# Patient Record
Sex: Male | Born: 1960
Health system: Southern US, Community
[De-identification: ages and names within clinical notes are randomized; demographics above are authoritative.]

## PROBLEM LIST (undated history)

## (undated) DIAGNOSIS — S060X9A Concussion with loss of consciousness of unspecified duration, initial encounter: Secondary | ICD-10-CM

## (undated) DIAGNOSIS — E785 Hyperlipidemia, unspecified: Secondary | ICD-10-CM

## (undated) DIAGNOSIS — S060XAA Concussion with loss of consciousness status unknown, initial encounter: Secondary | ICD-10-CM

## (undated) HISTORY — PX: EYE SURGERY: SHX253

## (undated) HISTORY — PX: NO PAST SURGERIES: SHX2092

## (undated) HISTORY — DX: Hyperlipidemia, unspecified: E78.5

---

## 2006-06-26 ENCOUNTER — Emergency Department (HOSPITAL_COMMUNITY): Admission: EM | Admit: 2006-06-26 | Discharge: 2006-06-26 | Payer: Self-pay | Admitting: *Deleted

## 2013-10-24 ENCOUNTER — Ambulatory Visit: Payer: Self-pay | Admitting: Family Medicine

## 2013-10-26 ENCOUNTER — Ambulatory Visit: Payer: Self-pay | Admitting: Family Medicine

## 2013-11-04 ENCOUNTER — Encounter: Payer: Self-pay | Admitting: Family Medicine

## 2013-11-04 ENCOUNTER — Ambulatory Visit (INDEPENDENT_AMBULATORY_CARE_PROVIDER_SITE_OTHER): Payer: BC Managed Care – PPO | Admitting: Family Medicine

## 2013-11-04 VITALS — BP 128/76 | HR 76 | Temp 97.2°F | Resp 18 | Ht 67.0 in | Wt 233.8 lb

## 2013-11-04 DIAGNOSIS — Z0000 Encounter for general adult medical examination without abnormal findings: Secondary | ICD-10-CM

## 2013-11-04 LAB — GLUCOSE, POCT (MANUAL RESULT ENTRY): POC GLUCOSE: 89 mg/dL (ref 70–99)

## 2013-11-04 NOTE — Progress Notes (Signed)
   Subjective:    Patient ID: Christopher Lin, male    DOB: 12/21/1960, 53 y.o.   MRN: [id number]  HPI Pt here for cpe. Is healthy in general and as no concerns. Sees PA at work and had bloodwork done a few months gaop at work. Says only thing that was concerning was cholesterol and he has been working on lifestyle mods since then.   # Health maint - mammo/pert FH - adopted, knows no FH - PSA and DRE- discussed psa, pros and cons including that USPTF advises against. Pt would like psa screening. - c-scope/pert FH - DUE - dexa - no h/o pathological fractures - BP screen - wnl - cholesterol screen - screened at work in the fall, says total was >200, would like to see where it is today - fasting glucose screen -  Checked at work, says thinks was fine. Has not eaten today.  - chlamydia screen (if male under age 51) - divorced, not currently sexually active. Declines std testing - tobacco - nonsmoker - alcohol - nondrinker - drugs - none - Hep C (born [id number]) - has not had checked - tdap (once over age 53 in place of DT) - thinks he has had this, will check records - DT (every 10 years until age 53, once age 73+) - " - flu - declines - pneumovax - not indicated  - zostavax (if over 3460) - not indicated - vit D - was told last year that vit D was low, took high dose and now is on 5K units daily. Did not have f/u testing.   Review of Systems A 12 point review of systems is negative except as per hpi.       Objective:   Physical Exam Nursing note and vitals reviewed. Constitutional: He is oriented to person, place, and time. He  appears well-developed and well-nourished.  HENT:  Right Ear: External ear normal.  Left Ear: External ear normal.  Nose: Nose normal.  Mouth/Throat: Oropharynx is clear and moist. No oropharyngeal exudate.  Eyes: Conjunctivae are normal. Pupils are equal, round, and reactive to light.  Neck: Normal range of motion. Neck supple. No thyromegaly present.    Cardiovascular: Normal rate, regular rhythm and normal heart sounds.   Pulmonary/Chest: Effort normal and breath sounds normal.  Abdominal: Soft. Bowel sounds are normal.  no distension. There is no tenderness. There is no rebound.  Lymphadenopathy:    He has no cervical adenopathy.  Neurological: He is alert and oriented to person, place, and time. He has normal reflexes.  Skin: Skin is warm and dry.He has no concerning moles or skin lesions. He does have a 1cm firm area on right abdomen, says has been there for many years. Seems mobile with skin. Consist with cyst.  Psychiatric: He has a normal mood and affect. His behavior is normal.        Assessment & Plan:  Christopher Lin was seen today for annual exam.  Diagnoses and associated orders for this visit:  Health maintenance examination - PSA - Ambulatory referral to Gastroenterology - screening colonoscopy - Lipid Panel - POCT glucose (manual entry) - Hepatic function panel - Hepatitis C antibody - Vit D  25 hydroxy (rtn osteoporosis monitoring)   F/u 1 year cpe, earlier if needed

## 2013-11-04 NOTE — Patient Instructions (Signed)

## 2013-11-05 LAB — LIPID PANEL
CHOL/HDL RATIO: 4.2 ratio
CHOLESTEROL: 179 mg/dL (ref 0–200)
HDL: 43 mg/dL (ref >39)
LDL Cholesterol: 119 mg/dL — ABNORMAL HIGH (ref 0–99)
Triglycerides: 86 mg/dL (ref <150)
VLDL: 17 mg/dL (ref 0–40)

## 2013-11-05 LAB — HEPATIC FUNCTION PANEL
ALBUMIN: 4.5 g/dL (ref 3.5–5.2)
ALT: 20 U/L (ref 0–53)
AST: 20 U/L (ref 0–37)
Alkaline Phosphatase: 64 U/L (ref 39–117)
BILIRUBIN TOTAL: 0.9 mg/dL (ref 0.2–1.2)
Bilirubin, Direct: 0.2 mg/dL (ref 0.0–0.3)
Indirect Bilirubin: 0.7 mg/dL (ref 0.2–1.2)
TOTAL PROTEIN: 6.4 g/dL (ref 6.0–8.3)

## 2013-11-05 LAB — VITAMIN D 25 HYDROXY (VIT D DEFICIENCY, FRACTURES): VIT D 25 HYDROXY: 69 ng/mL (ref 30–89)

## 2013-11-22 ENCOUNTER — Telehealth: Payer: Self-pay | Admitting: *Deleted

## 2013-11-22 NOTE — Telephone Encounter (Signed)
Pt called and left VM stating that he had labs drawn on office visit in February and has not received results. Pt requests lab results. Will route to MD

## 2013-11-25 ENCOUNTER — Encounter (INDEPENDENT_AMBULATORY_CARE_PROVIDER_SITE_OTHER): Payer: Self-pay | Admitting: *Deleted

## 2013-11-25 NOTE — Telephone Encounter (Signed)
Labs fine - thanks AW

## 2013-11-25 NOTE — Telephone Encounter (Signed)
Pt notified ans appreciative.

## 2013-12-15 ENCOUNTER — Encounter (INDEPENDENT_AMBULATORY_CARE_PROVIDER_SITE_OTHER): Payer: Self-pay | Admitting: *Deleted

## 2013-12-15 ENCOUNTER — Telehealth: Payer: Self-pay

## 2013-12-15 ENCOUNTER — Other Ambulatory Visit (INDEPENDENT_AMBULATORY_CARE_PROVIDER_SITE_OTHER): Payer: Self-pay | Admitting: *Deleted

## 2013-12-15 DIAGNOSIS — Z1211 Encounter for screening for malignant neoplasm of colon: Secondary | ICD-10-CM

## 2013-12-15 NOTE — Telephone Encounter (Signed)
Per Ginger, she called and pt is supposed to let us know if he is going to go with Dr. Karilyn Cotaehman or here for his colonoscopy.  He was referred to both.

## 2013-12-28 NOTE — Telephone Encounter (Signed)
Pt is scheduled with Dr. Karilyn Cotaehman for a colonoscopy on 04/27/2014.

## 2014-02-20 ENCOUNTER — Telehealth (INDEPENDENT_AMBULATORY_CARE_PROVIDER_SITE_OTHER): Payer: Self-pay | Admitting: *Deleted

## 2014-02-20 DIAGNOSIS — Z1211 Encounter for screening for malignant neoplasm of colon: Secondary | ICD-10-CM

## 2014-02-20 NOTE — Telephone Encounter (Signed)
Patient needs movi prep 

## 2014-03-02 MED ORDER — PEG-KCL-NACL-NASULF-NA ASC-C 100 G PO SOLR: 1.0000 | Freq: Once | ORAL | Status: DC

## 2014-04-04 ENCOUNTER — Telehealth (INDEPENDENT_AMBULATORY_CARE_PROVIDER_SITE_OTHER): Payer: Self-pay | Admitting: *Deleted

## 2014-04-04 NOTE — Telephone Encounter (Signed)
  Procedure: tcs  Reason/Indication:  screening  Has patient had this procedure before?  no  If so, when, by whom and where?    Is there a family history of colon cancer?  ?  Who?  What age when diagnosed?    Is patient diabetic?   no      Does patient have prosthetic heart valve?  no  Do you have a pacemaker?  no  Has patient ever had endocarditis? no  Has patient had joint replacement within last 12 months?  no  Does patient tend to be constipated or take laxatives? no  Is patient on Coumadin, Plavix and/or Aspirin? no  Medications: none  Allergies: pcn  Medication Adjustment:   Procedure date & time: 05/04/14 at 730

## 2014-04-05 NOTE — Telephone Encounter (Signed)
agree

## 2014-04-11 ENCOUNTER — Encounter (HOSPITAL_COMMUNITY): Payer: Self-pay | Admitting: Pharmacy Technician

## 2014-05-03 ENCOUNTER — Other Ambulatory Visit (INDEPENDENT_AMBULATORY_CARE_PROVIDER_SITE_OTHER): Payer: Self-pay | Admitting: *Deleted

## 2014-05-03 DIAGNOSIS — Z1211 Encounter for screening for malignant neoplasm of colon: Secondary | ICD-10-CM

## 2014-05-04 ENCOUNTER — Ambulatory Visit (HOSPITAL_COMMUNITY)
Admission: RE | Admit: 2014-05-04 | Discharge: 2014-05-04 | Disposition: A | Discharge status: Home / Self Care | Payer: BC Managed Care – PPO | Source: Ambulatory Visit | Attending: Internal Medicine | Admitting: Internal Medicine

## 2014-05-04 ENCOUNTER — Encounter (HOSPITAL_COMMUNITY)
Admission: RE | Disposition: A | Discharge status: Home / Self Care | Payer: Self-pay | Source: Ambulatory Visit | Attending: Internal Medicine

## 2014-05-04 ENCOUNTER — Encounter (HOSPITAL_COMMUNITY): Payer: Self-pay | Admitting: *Deleted

## 2014-05-04 DIAGNOSIS — K644 Residual hemorrhoidal skin tags: Secondary | ICD-10-CM

## 2014-05-04 DIAGNOSIS — E785 Hyperlipidemia, unspecified: Secondary | ICD-10-CM | POA: Insufficient documentation

## 2014-05-04 DIAGNOSIS — Z1211 Encounter for screening for malignant neoplasm of colon: Secondary | ICD-10-CM | POA: Insufficient documentation

## 2014-05-04 DIAGNOSIS — K5730 Diverticulosis of large intestine without perforation or abscess without bleeding: Secondary | ICD-10-CM | POA: Insufficient documentation

## 2014-05-04 HISTORY — PX: COLONOSCOPY: SHX5424

## 2014-05-04 SURGERY — COLONOSCOPY
Anesthesia: Moderate Sedation

## 2014-05-04 MED ORDER — MEPERIDINE HCL 50 MG/ML IJ SOLN
INTRAMUSCULAR | Status: AC
  Filled 2014-05-04: qty 1

## 2014-05-04 MED ORDER — MEPERIDINE HCL 50 MG/ML IJ SOLN
INTRAMUSCULAR | Status: DC | PRN
Start: 2014-05-04 — End: 2014-05-04
  Administered 2014-05-04 (×2): 25 mg via INTRAVENOUS

## 2014-05-04 MED ORDER — MIDAZOLAM HCL 5 MG/5ML IJ SOLN
INTRAMUSCULAR | Status: DC | PRN
  Administered 2014-05-04 (×4): 2 mg via INTRAVENOUS

## 2014-05-04 MED ORDER — SODIUM CHLORIDE 0.9 % IV SOLN
INTRAVENOUS | Status: DC
Start: 2014-05-04 — End: 2014-05-04
  Administered 2014-05-04: 07:00:00 via INTRAVENOUS

## 2014-05-04 MED ORDER — STERILE WATER FOR IRRIGATION IR SOLN
Status: DC | PRN
  Administered 2014-05-04: 08:00:00

## 2014-05-04 MED ORDER — MIDAZOLAM HCL 5 MG/5ML IJ SOLN
INTRAMUSCULAR | Status: AC
  Filled 2014-05-04: qty 10

## 2014-05-04 NOTE — Discharge Instructions (Signed)
Resume usual medications and high fiber diet. No driving for 24 hours. Next screening exam in 10 years. Colonoscopy, Care After Refer to this sheet in the next few weeks. These instructions provide you with information on caring for yourself after your procedure. Your health care provider may also give you more specific instructions. Your treatment has been planned according to current medical practices, but problems sometimes occur. Call your health care provider if you have any problems or questions after your procedure. WHAT TO EXPECT AFTER THE PROCEDURE  After your procedure, it is typical to have the following:  A small amount of blood in your stool.  Moderate amounts of gas and mild abdominal cramping or bloating. HOME CARE INSTRUCTIONS  Do not drive, operate machinery, or sign important documents for 24 hours.  You may shower and resume your regular physical activities, but move at a slower pace for the first 24 hours.  Take frequent rest periods for the first 24 hours.  Walk around or put a warm pack on your abdomen to help reduce abdominal cramping and bloating.  Drink enough fluids to keep your urine clear or pale yellow.  You may resume your normal diet as instructed by your health care provider. Avoid heavy or fried foods that are hard to digest.  Avoid drinking alcohol for 24 hours or as instructed by your health care provider.  Only take over-the-counter or prescription medicines as directed by your health care provider.  If a tissue sample (biopsy) was taken during your procedure:  Do not take aspirin or blood thinners for 7 days, or as instructed by your health care provider.  Do not drink alcohol for 7 days, or as instructed by your health care provider.  Eat soft foods for the first 24 hours. SEEK MEDICAL CARE IF: You have persistent spotting of blood in your stool 2-3 days after the procedure. SEEK IMMEDIATE MEDICAL CARE IF:  You have more than a small  spotting of blood in your stool.  You pass large blood clots in your stool.  Your abdomen is swollen (distended).  You have nausea or vomiting.  You have a fever.  You have increasing abdominal pain that is not relieved with medicine.  Diverticulosis Diverticulosis is the condition that develops when small pouches (diverticula) form in the wall of your colon. Your colon, or large intestine, is where water is absorbed and stool is formed. The pouches form when the inside layer of your colon pushes through weak spots in the outer layers of your colon. CAUSES  No one knows exactly what causes diverticulosis. RISK FACTORS  Being older than 50. Your risk for this condition increases with age. Diverticulosis is rare in people younger than 40 years. By age 63, almost everyone has it.  Eating a low-fiber diet.  Being frequently constipated.  Being overweight.  Not getting enough exercise.  Smoking.  Taking over-the-counter pain medicines, like aspirin and ibuprofen. SYMPTOMS  Most people with diverticulosis do not have symptoms. DIAGNOSIS  Because diverticulosis often has no symptoms, health care providers often discover the condition during an exam for other colon problems. In many cases, a health care provider will diagnose diverticulosis while using a flexible scope to examine the colon (colonoscopy). TREATMENT  If you have never developed an infection related to diverticulosis, you may not need treatment. If you have had an infection before, treatment may include:  Eating more fruits, vegetables, and grains.  Taking a fiber supplement.  Taking a live bacteria supplement (probiotic).  Taking medicine to relax your colon. HOME CARE INSTRUCTIONS   Drink at least 6-8 glasses of water each day to prevent constipation.  Try not to strain when you have a bowel movement.  Keep all follow-up appointments. If you have had an infection before:  Increase the fiber in your diet  as directed by your health care provider or dietitian.  Take a dietary fiber supplement if your health care provider approves.  Only take medicines as directed by your health care provider. SEEK MEDICAL CARE IF:   You have abdominal pain.  You have bloating.  You have cramps.  You have not gone to the bathroom in 3 days. SEEK IMMEDIATE MEDICAL CARE IF:   Your pain gets worse.  Yourbloating becomes very bad.  You have a fever or chills, and your symptoms suddenly get worse.  You begin vomiting.  You have bowel movements that are bloody or black. MAKE SURE YOU:  Understand these instructions.  Will watch your condition.  Will get help right away if you are not doing well or get worse.

## 2014-05-04 NOTE — Op Note (Signed)
COLONOSCOPY PROCEDURE REPORT  PATIENT:  Christopher Lin  MR#:  [id number] Birthdate:  [birth year], 53 y.o., male Endoscopist:  Dr. Malissa Hippo, MD Referred By:  Dr. Marchelle Folks. Lucretia Roers, MD  Procedure Date: 05/04/2014  Procedure:   Colonoscopy  Indications:  Patient is 52 year old Caucasian male who is here for screening colonoscopy. Risk status is unknown S. family history is unavailable.  Informed Consent:  The procedure and risks were reviewed with the patient and informed consent was obtained.  Medications:  Demerol 50 mg IV Versed 8 mg IV  Description of procedure:  After a digital rectal exam was performed, that colonoscope was advanced from the anus through the rectum and colon to the area of the cecum, ileocecal valve and appendiceal orifice. The cecum was deeply intubated. These structures were well-seen and photographed for the record. From the level of the cecum and ileocecal valve, the scope was slowly and cautiously withdrawn. The mucosal surfaces were carefully surveyed utilizing scope tip to flexion to facilitate fold flattening as needed. The scope was pulled down into the rectum where a thorough exam including retroflexion was performed.  Findings:   Prep excellent. Single diverticulum at hepatic flexure with a few more small ones at sigmoid colon. Redundant sigmoid colon. No evidence of colonic polyps. Normal rectal mucosa. Small hemorrhoids below the dentate line.    Therapeutic/Diagnostic Maneuvers Performed:  None  Complications:  None  Cecal Withdrawal Time:  7 minutes  Impression:  Examination performed to cecum. Single diverticulum at hepatic flexure and mild sigmoid colon diverticulosis. External hemorrhoids.  Recommendations:  Standard instructions given. Next screening exam in 10 years.  REHMAN,NAJEEB U  05/04/2014 8:16 AM  CC: Dr. Lucretia Roers, Kelle Darting, MD & Dr. Bonnetta Barry ref. provider found

## 2014-05-04 NOTE — H&P (Signed)
Christopher Lin is an 53 y.o. male.   Chief Complaint: Patient is here for colonoscopy. HPI: Patient is a 53 year old Caucasian male who is here for screening colonoscopy. He denies abdominal pain change in his bowel habits or rectal bleeding. Family history is that he was adopted.  Past Medical History  Diagnosis Date  . Hyperlipidemia     Past Surgical History  Procedure Laterality Date  . No past surgeries      Family History  Problem Relation Age of Onset  . Adopted: Yes   Social History:  reports that he has never smoked. He has never used smokeless tobacco. He reports that he does not drink alcohol or use illicit drugs.  Allergies: No Known Allergies  Medications Prior to Admission  Medication Sig Dispense Refill  . Cholecalciferol (VITAMIN D3) 5000 UNITS TABS Take 5,000 Units by mouth daily.      . Flaxseed, Linseed, (FLAX SEEDS PO) Take 1 tablet by mouth daily.      . peg 3350 powder (MOVIPREP) 100 G SOLR Take 1 kit (200 g total) by mouth once.  1 kit  0    No results found for this or any previous visit (from the past 48 hour(s)). No results found.  ROS  Blood pressure 129/78, pulse 61, temperature 98 F (36.7 C), temperature source Oral, resp. rate 18, height _0  (1.727 m), weight 223 lb (101.152 kg), SpO2 99.00%. Physical Exam  Constitutional: He appears well-developed and well-nourished.  HENT:  Mouth/Throat: Oropharynx is clear and moist.  Eyes: Conjunctivae are normal. No scleral icterus.  Neck: No thyromegaly present.  Cardiovascular: Normal rate, regular rhythm and normal heart sounds.   No murmur heard. Respiratory: Effort normal and breath sounds normal.  GI: Soft. He exhibits no mass. There is no tenderness. There is no rebound.  Musculoskeletal: He exhibits no edema.  Lymphadenopathy:    He has no cervical adenopathy.  Neurological: He is alert.  Skin: Skin is warm and dry.     Assessment/Plan Screening colonoscopy. Family history is not  available.  Christopher Lin U 05/04/2014, 7:31 AM

## 2014-05-05 ENCOUNTER — Encounter (HOSPITAL_COMMUNITY): Payer: Self-pay | Admitting: Internal Medicine

## 2016-01-24 DIAGNOSIS — E785 Hyperlipidemia, unspecified: Secondary | ICD-10-CM | POA: Insufficient documentation

## 2016-01-24 DIAGNOSIS — E669 Obesity, unspecified: Secondary | ICD-10-CM | POA: Insufficient documentation

## 2017-11-07 ENCOUNTER — Emergency Department (HOSPITAL_COMMUNITY): Payer: BLUE CROSS/BLUE SHIELD

## 2017-11-07 ENCOUNTER — Other Ambulatory Visit: Payer: Self-pay

## 2017-11-07 ENCOUNTER — Emergency Department (HOSPITAL_COMMUNITY)
Admission: EM | Admit: 2017-11-07 | Discharge: 2017-11-07 | Disposition: A | Discharge status: Home / Self Care | Payer: BLUE CROSS/BLUE SHIELD | Attending: Emergency Medicine | Admitting: Emergency Medicine

## 2017-11-07 ENCOUNTER — Encounter (HOSPITAL_COMMUNITY): Payer: Self-pay

## 2017-11-07 DIAGNOSIS — Y929 Unspecified place or not applicable: Secondary | ICD-10-CM | POA: Diagnosis not present

## 2017-11-07 DIAGNOSIS — R413 Other amnesia: Secondary | ICD-10-CM | POA: Diagnosis not present

## 2017-11-07 DIAGNOSIS — S4981XA Other specified injuries of right shoulder and upper arm, initial encounter: Secondary | ICD-10-CM | POA: Diagnosis present

## 2017-11-07 DIAGNOSIS — Y999 Unspecified external cause status: Secondary | ICD-10-CM | POA: Diagnosis not present

## 2017-11-07 DIAGNOSIS — W109XXA Fall (on) (from) unspecified stairs and steps, initial encounter: Secondary | ICD-10-CM | POA: Diagnosis not present

## 2017-11-07 DIAGNOSIS — S060X0A Concussion without loss of consciousness, initial encounter: Secondary | ICD-10-CM | POA: Insufficient documentation

## 2017-11-07 DIAGNOSIS — W19XXXA Unspecified fall, initial encounter: Secondary | ICD-10-CM

## 2017-11-07 DIAGNOSIS — Y939 Activity, unspecified: Secondary | ICD-10-CM | POA: Insufficient documentation

## 2017-11-07 HISTORY — DX: Concussion with loss of consciousness of unspecified duration, initial encounter: S06.0X9A

## 2017-11-07 HISTORY — DX: Concussion with loss of consciousness status unknown, initial encounter: S06.0XAA

## 2017-11-07 LAB — CBC WITH DIFFERENTIAL/PLATELET
BASOS PCT: 0 %
Basophils Absolute: 0 10*3/uL (ref 0.0–0.1)
EOS ABS: 0 10*3/uL (ref 0.0–0.7)
Eosinophils Relative: 0 %
HCT: 44.4 % (ref 39.0–52.0)
Hemoglobin: 14.5 g/dL (ref 13.0–17.0)
Lymphocytes Relative: 7 %
Lymphs Abs: 1.2 10*3/uL (ref 0.7–4.0)
MCH: 29.2 pg (ref 26.0–34.0)
MCHC: 32.7 g/dL (ref 30.0–36.0)
MCV: 89.5 fL (ref 78.0–100.0)
MONO ABS: 1 10*3/uL (ref 0.1–1.0)
MONOS PCT: 6 %
Neutro Abs: 14.5 10*3/uL — ABNORMAL HIGH (ref 1.7–7.7)
Neutrophils Relative %: 87 %
PLATELETS: 251 10*3/uL (ref 150–400)
RBC: 4.96 MIL/uL (ref 4.22–5.81)
RDW: 13.4 % (ref 11.5–15.5)
WBC: 16.7 10*3/uL — ABNORMAL HIGH (ref 4.0–10.5)

## 2017-11-07 LAB — COMPREHENSIVE METABOLIC PANEL
ALT: 24 U/L (ref 17–63)
ANION GAP: 10 (ref 5–15)
AST: 21 U/L (ref 15–41)
Albumin: 3.8 g/dL (ref 3.5–5.0)
Alkaline Phosphatase: 61 U/L (ref 38–126)
BUN: 10 mg/dL (ref 6–20)
CHLORIDE: 104 mmol/L (ref 101–111)
CO2: 22 mmol/L (ref 22–32)
Calcium: 8.4 mg/dL — ABNORMAL LOW (ref 8.9–10.3)
Creatinine, Ser: 0.93 mg/dL (ref 0.61–1.24)
GFR calc Af Amer: >60 mL/min (ref >60)
GFR calc non Af Amer: >60 mL/min (ref >60)
Glucose, Bld: 106 mg/dL — ABNORMAL HIGH (ref 65–99)
Potassium: 4.2 mmol/L (ref 3.5–5.1)
SODIUM: 136 mmol/L (ref 135–145)
Total Bilirubin: 1.2 mg/dL (ref 0.3–1.2)
Total Protein: 6.5 g/dL (ref 6.5–8.1)

## 2017-11-07 MED ORDER — SODIUM CHLORIDE 0.9 % IV BOLUS (SEPSIS)
1000.0000 mL | Freq: Once | INTRAVENOUS | Status: AC
  Administered 2017-11-07: 1000 mL via INTRAVENOUS

## 2017-11-07 NOTE — ED Triage Notes (Signed)
Per ems, pt lives at home with son and his son heard pt fall this morning approx ago and when he got to him, he was laying on the floor unable to remember anything and repeating himself.  Upon arrival pt on ems stretcher alert and oriented talking to PCP.

## 2017-11-07 NOTE — Discharge Instructions (Signed)
X-ray of head, neck, right shoulder show no acute injury.  I do think Mr. Christopher Lin is suffered a concussion.  It is not unusual to have amnesia with a concussion.  Rest.  No work until your thinking improves.  Return here for any concerns

## 2017-11-07 NOTE — ED Notes (Signed)
Pt ambulated around nurses station. Pt had no complaints except for right shoulder pain. Dr. Adriana Simasook informed.

## 2017-11-07 NOTE — ED Notes (Signed)
Pt transported to xray 

## 2017-11-07 NOTE — ED Notes (Signed)
Pt returned from radiology.

## 2017-11-07 NOTE — ED Notes (Signed)
EDP at bedside updating patient and family. 

## 2017-11-07 NOTE — ED Provider Notes (Signed)
[facility] EMERGENCY DEPARTMENT Provider Note   CSN: [id number] Arrival date & time: 11/07/17  1212     History   Chief Complaint Chief Complaint  Patient presents with  . Fall  . Altered Mental Status    HPI Candida PeelingDwight E Molock is a 57 y.o. male.  Level 5 caveat for amnesia to event.  History provided by son and ex-wife.  Family reports accidental fall down approximately 11 steps earlier today.  He is amnestic to the event.  No prodromal chest pain, dyspnea, neurological deficits.  He has not been ill lately.  He complains of pain to his right shoulder and amnesia, but otherwise no complaints.      Past Medical History:  Diagnosis Date  . Concussion   . Hyperlipidemia     There are no active problems to display for this patient.   Past Surgical History:  Procedure Laterality Date  . COLONOSCOPY N/A 05/04/2014   Procedure: COLONOSCOPY;  Surgeon: Malissa HippoNajeeb U Rehman, MD;  Location: [facility];  Service: Endoscopy;  Laterality: N/A;  730-rescheduled Ann notified pt  . NO PAST SURGERIES         Home Medications    Prior to Admission medications   Medication Sig Start Date End Date Taking? Authorizing Provider  Cholecalciferol (VITAMIN D3) 5000 UNITS TABS Take 5,000 Units by mouth daily.   Yes [provider]  doxycycline (PERIOSTAT) 20 MG tablet Take 1 tablet by mouth 2 (two) times daily. 10/06/17  Yes [provider]  Flaxseed, Linseed, (FLAX SEEDS PO) Take 1 tablet by mouth daily.   Yes [provider]  lovastatin (MEVACOR) 20 MG tablet Take 1 tablet by mouth at bedtime. 10/06/17  Yes [provider]    Family History Family History  Adopted: Yes    Social History Social History   Tobacco Use  . Smoking status: Never Smoker  . Smokeless tobacco: Never Used  Substance Use Topics  . Alcohol use: No  . Drug use: No     Allergies   Penicillins   Review of Systems Review of Systems  All other systems reviewed and are  negative.    Physical Exam Updated Vital Signs BP (!) 119/59   Pulse 87   Temp 98.4 F (36.9 C) (Oral)   Resp 16   Ht 5\' 9"  (1.753 m)   Wt 104.3 kg (230 lb)   SpO2 98%   BMI 33.97 kg/m   Physical Exam  Constitutional: He is oriented to person, place, and time. He appears well-developed and well-nourished.  HENT:  Head: Normocephalic.  Minimal occipital tenderness  Eyes: Conjunctivae are normal.  Neck: Neck supple.  Cardiovascular: Normal rate and regular rhythm.  Pulmonary/Chest: Effort normal and breath sounds normal.  Abdominal: Soft. Bowel sounds are normal.  Musculoskeletal:  Tenderness surrounding the right shoulder.  Pain with range of motion.  Neurological: He is alert and oriented to person, place, and time.  Skin: Skin is warm and dry.  Psychiatric: He has a normal mood and affect. His behavior is normal.  Nursing note and vitals reviewed.    ED Treatments / Results  Labs (all labs ordered are listed, but only abnormal results are displayed) Labs Reviewed  CBC WITH DIFFERENTIAL/PLATELET - Abnormal; Notable for the following components:      Result Value   WBC 16.7 (*)    Neutro Abs 14.5 (*)    All other components within normal limits  COMPREHENSIVE METABOLIC PANEL - Abnormal; Notable for the  following components:   Glucose, Bld 106 (*)    Calcium 8.4 (*)    All other components within normal limits  URINALYSIS, ROUTINE W REFLEX MICROSCOPIC    EKG  EKG Interpretation  Date/Time:  Saturday November 07 2017 12:21:50 EST Ventricular Rate:  65 PR Interval:    QRS Duration: 89 QT Interval:  411 QTC Calculation: 428 R Axis:   35 Text Interpretation:  Sinus rhythm Abnormal R-wave progression, early transition Confirmed by Donnetta Hutching ([id number]) on 11/07/2017 12:30:18 PM       Radiology Dg Shoulder Right  Result Date: 11/07/2017 CLINICAL DATA:  57 year old male with unwitnessed fall this morning. Found down. EXAM: RIGHT SHOULDER - 2+ VIEW COMPARISON:   None. FINDINGS: AP and scapular Y-views. No glenohumeral joint dislocation. Bone mineralization is within normal limits. The proximal right humerus appears intact. The visible right clavicle in scapula appear intact. Visible right ribs appear intact. IMPRESSION: No acute fracture or dislocation identified about the right shoulder. Electronically Signed   By: Odessa Fleming M.D.   On: 11/07/2017 13:44   Ct Head Wo Contrast  Result Date: 11/07/2017 CLINICAL DATA:  57 year old male with unwitnessed fall this morning. Found down. Prior concussion. EXAM: CT HEAD WITHOUT CONTRAST CT CERVICAL SPINE WITHOUT CONTRAST TECHNIQUE: Multidetector CT imaging of the head and cervical spine was performed following the standard protocol without intravenous contrast. Multiplanar CT image reconstructions of the cervical spine were also generated. COMPARISON:  Head CT without contrast 06/26/2006. FINDINGS: CT HEAD FINDINGS Brain: Cerebral volume is normal for age. No midline shift, ventriculomegaly, mass effect, evidence of mass lesion, intracranial hemorrhage or evidence of cortically based acute infarction. Gray-white matter differentiation is within normal limits throughout the brain. Vascular: Mild Calcified atherosclerosis at the skull base. No suspicious intracranial vascular hyperdensity. Skull: No skull fracture identified. Sinuses/Orbits: Trace low-density appearing fluid level in the left maxillary sinus. Chronic mild maxillary and left anterior ethmoid mucosal thickening appears not significantly changed since 2007. Other paranasal sinuses, tympanic cavities, and mastoids are stable and well pneumatized. Other: Left occiput region scalp hematoma measuring up to 9 mm in thickness. Underlying occipital bones appear intact. Other scalp soft tissues appear within normal limits. Visualized orbit soft tissues are within normal limits. CT CERVICAL SPINE FINDINGS Alignment: Mild dextroconvex curvature of the cervical spine. Mild  straightening of lordosis. Cervicothoracic junction alignment is within normal limits. Bilateral posterior element alignment is within normal limits. Skull base and vertebrae: Visualized skull base is intact. No atlanto-occipital dissociation. No cervical spine fracture identified. Soft tissues and spinal canal: No prevertebral fluid or swelling. No visible canal hematoma. Negative noncontrast neck soft tissues. Disc levels: Moderate cervical facet degeneration on the left at C2-C3 and C4-C5. Lower cervical disc space loss and endplate spurring. Possible mild spinal stenosis at C5-C6 and C6-C7. Upper chest: Visible upper thoracic levels appear intact. Negative lung apices. Negative noncontrast thoracic inlet. IMPRESSION: 1. Left occiput scalp hematoma without underlying skull fracture. 2.  Normal noncontrast CT appearance of the brain. 3. No acute fracture in the cervical spine. Possible degenerative mild spinal stenosis at C5-C6 and C6-C7. 4. Mild paranasal sinus disease appears similar to a 2007 head CT. Electronically Signed   By: Odessa Fleming M.D.   On: 11/07/2017 13:53   Ct Cervical Spine Wo Contrast  Result Date: 11/07/2017 CLINICAL DATA:  57 year old male with unwitnessed fall this morning. Found down. Prior concussion. EXAM: CT HEAD WITHOUT CONTRAST CT CERVICAL SPINE WITHOUT CONTRAST TECHNIQUE: Multidetector CT imaging of the head  and cervical spine was performed following the standard protocol without intravenous contrast. Multiplanar CT image reconstructions of the cervical spine were also generated. COMPARISON:  Head CT without contrast 06/26/2006. FINDINGS: CT HEAD FINDINGS Brain: Cerebral volume is normal for age. No midline shift, ventriculomegaly, mass effect, evidence of mass lesion, intracranial hemorrhage or evidence of cortically based acute infarction. Gray-white matter differentiation is within normal limits throughout the brain. Vascular: Mild Calcified atherosclerosis at the skull base. No  suspicious intracranial vascular hyperdensity. Skull: No skull fracture identified. Sinuses/Orbits: Trace low-density appearing fluid level in the left maxillary sinus. Chronic mild maxillary and left anterior ethmoid mucosal thickening appears not significantly changed since 2007. Other paranasal sinuses, tympanic cavities, and mastoids are stable and well pneumatized. Other: Left occiput region scalp hematoma measuring up to 9 mm in thickness. Underlying occipital bones appear intact. Other scalp soft tissues appear within normal limits. Visualized orbit soft tissues are within normal limits. CT CERVICAL SPINE FINDINGS Alignment: Mild dextroconvex curvature of the cervical spine. Mild straightening of lordosis. Cervicothoracic junction alignment is within normal limits. Bilateral posterior element alignment is within normal limits. Skull base and vertebrae: Visualized skull base is intact. No atlanto-occipital dissociation. No cervical spine fracture identified. Soft tissues and spinal canal: No prevertebral fluid or swelling. No visible canal hematoma. Negative noncontrast neck soft tissues. Disc levels: Moderate cervical facet degeneration on the left at C2-C3 and C4-C5. Lower cervical disc space loss and endplate spurring. Possible mild spinal stenosis at C5-C6 and C6-C7. Upper chest: Visible upper thoracic levels appear intact. Negative lung apices. Negative noncontrast thoracic inlet. IMPRESSION: 1. Left occiput scalp hematoma without underlying skull fracture. 2.  Normal noncontrast CT appearance of the brain. 3. No acute fracture in the cervical spine. Possible degenerative mild spinal stenosis at C5-C6 and C6-C7. 4. Mild paranasal sinus disease appears similar to a 2007 head CT. Electronically Signed   By: Odessa Fleming M.D.   On: 11/07/2017 13:53    Procedures Procedures (including critical care time)  Medications Ordered in ED Medications  sodium chloride 0.9 % bolus 1,000 mL (0 mLs Intravenous Stopped  11/07/17 1340)     Initial Impression / Assessment and Plan / ED Course  I have reviewed the triage vital signs and the nursing notes.  Pertinent labs & imaging results that were available during my care of the patient were reviewed by me and considered in my medical decision making (see chart for details).     She presents with a likely accidental fall down multiple steps.  He is amnestic to the event; however, he has no gross neurological deficits.  CT head, CT cervical spine, and films of right shoulder all negative.  Screening labs, EKG within normal limits.  Patient was ambulatory prior to discharge and did well.  Tests were discussed with the patient, his son, his ex-wife.  Final Clinical Impressions(s) / ED Diagnoses   Final diagnoses:  Fall, initial encounter  Concussion without loss of consciousness, initial encounter  Amnesia    ED Discharge Orders    None       Donnetta Hutching, MD 11/07/17 [phone]

## 2017-11-17 ENCOUNTER — Ambulatory Visit: Payer: BLUE CROSS/BLUE SHIELD | Admitting: Orthopedic Surgery

## 2017-11-17 VITALS — BP 129/81 | HR 76 | Ht 69.0 in | Wt 233.0 lb

## 2017-11-17 DIAGNOSIS — S43101A Unspecified dislocation of right acromioclavicular joint, initial encounter: Secondary | ICD-10-CM

## 2017-11-17 NOTE — Progress Notes (Signed)
NEW PATIENT OFFICE VISIT   Chief Complaint  Patient presents with  . Shoulder Pain    Right shoulder pain,     [age] year old male presents for evaluation of right shoulder injury  He fell down the stairs with concussion on March 2 went to the ER had a CT scan showed some degenerative changes at C5-6 no evidence of fracture or dislocation x-ray was read as normal but shows prominence of the distal clavicle after my review  He complains of dull aching intermittent pain right shoulder 10 days associated with swelling but no loss of motion    Review of Systems  Constitutional: Negative for chills.  Musculoskeletal: Negative for neck pain.  Skin: Negative for itching and rash.  Neurological: Positive for tingling and sensory change.     Past Medical History:  Diagnosis Date  . Concussion   . Hyperlipidemia     Past Surgical History:  Procedure Laterality Date  . COLONOSCOPY N/A 05/04/2014   Procedure: COLONOSCOPY;  Surgeon: Malissa HippoNajeeb U Rehman, MD;  Location: [facility];  Service: Endoscopy;  Laterality: N/A;  730-rescheduled Ann notified pt  . NO PAST SURGERIES      Family History  Adopted: Yes   Social History   Tobacco Use  . Smoking status: Never Smoker  . Smokeless tobacco: Never Used  Substance Use Topics  . Alcohol use: No  . Drug use: No    No outpatient medications have been marked as taking for the 11/17/17 encounter (Office Visit) with Vickki HearingHarrison, Jamont Mellin E, MD.    BP 129/81   Pulse 76   Ht 5\' 9"  (1.753 m)   Wt 233 lb (105.7 kg)   BMI 34.41 kg/m   Physical Exam  Constitutional: He is oriented to person, place, and time. He appears well-developed and well-nourished.  Vital signs have been reviewed and are stable. Gen. appearance the patient is well-developed and well-nourished with normal grooming and hygiene.   Musculoskeletal:  GAIT IS normal  Neurological: He is alert and oriented to person, place, and time.  Skin: Skin is warm and dry. No  erythema.  Psychiatric: He has a normal mood and affect.  Vitals reviewed.   Right Shoulder Exam  Right shoulder exam is normal.  Tenderness  The patient is experiencing tenderness in the acromioclavicular joint.  Range of Motion  The patient has normal right shoulder ROM.  Muscle Strength  The patient has normal right shoulder strength.  Tests  Apprehension: negative  Other  Erythema: absent Sensation: normal Pulse: present  Comments:  Bruising and ecchymosis around the shoulder  Neck nontender   Left Shoulder Exam  Left shoulder exam is normal.  Tenderness  The patient is experiencing no tenderness.   Range of Motion  The patient has normal left shoulder ROM.  Muscle Strength  The patient has normal left shoulder strength.  Tests  Apprehension: negative  Other  Erythema: absent Sensation: normal Pulse: present       MEDICAL DECISION SECTION  xrays ordered? no  My independent reading of xrays: Films at the hospital included a CT scan I have the report I looked at the x-rays he has a type II acromion some mild glenohumeral Shenton's line disruption, clinically has no history of rotator cuff problems  X-ray does not adequately show but there is prominence of the distal clavicle   Encounter Diagnosis  Name Primary?  . AC separation, right, initial encounter Yes   Type II  PLAN:    Recommend follow-up on  12 April should recover over 4-6 weeks

## 2017-12-21 ENCOUNTER — Encounter: Payer: Self-pay | Admitting: Orthopedic Surgery

## 2017-12-21 ENCOUNTER — Ambulatory Visit: Payer: BLUE CROSS/BLUE SHIELD | Admitting: Orthopedic Surgery

## 2017-12-21 VITALS — BP 131/84 | HR 79 | Ht 69.0 in | Wt 231.0 lb

## 2017-12-21 DIAGNOSIS — S43101D Unspecified dislocation of right acromioclavicular joint, subsequent encounter: Secondary | ICD-10-CM

## 2017-12-21 NOTE — Progress Notes (Signed)
Chief Complaint  Patient presents with  . Shoulder Pain    right shoulder AC seperation feels better     Status post AC separation right shoulder date of injury was November 07, 2017  Patient says he has almost all of his range of motion back and he has minimal discomfort in the shoulder.  Examination shows a well-developed well-nourished male he has a prominent right distal clavicle he has full forward elevation and internal and external rotation and no pain reaching across his chest  He has no neurovascular deficits  Encounter Diagnosis  Name Primary?  . Separation of right acromioclavicular joint, subsequent encounter Yes   Recommend resume normal activities gradually follow-up if any difficulty

## 2019-07-29 ENCOUNTER — Other Ambulatory Visit: Payer: Self-pay

## 2019-07-29 DIAGNOSIS — Z20822 Contact with and (suspected) exposure to covid-19: Secondary | ICD-10-CM

## 2019-08-01 LAB — NOVEL CORONAVIRUS, NAA: SARS-CoV-2, NAA: NOT DETECTED

## 2019-09-06 IMAGING — CT CT CERVICAL SPINE W/O CM
4 of 7 series · 13 of 33 positions shown, 14 images · non-contrast
Comparison: Head CT without contrast 06/26/2006.

CLINICAL DATA: 56-year-old male with unwitnessed fall this morning.
Found down. Prior concussion.

EXAM:
CT HEAD WITHOUT CONTRAST
CT CERVICAL SPINE WITHOUT CONTRAST
TECHNIQUE: Multidetector CT imaging of the head and cervical spine was
performed following the standard protocol without intravenous
contrast. Multiplanar CT image reconstructions of the cervical spine
were also generated.

[Series 8: c spine soft · axial · 0.38mm/px · z∈[+151,+231]mm · 3 of 102 slices shown]
[im 21/102  soft-tissue]
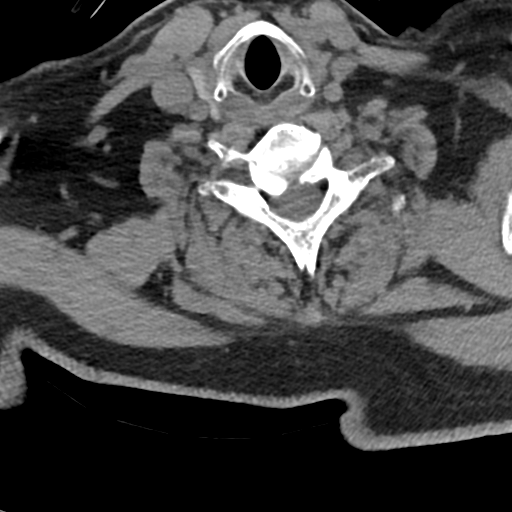
[im 41/102  soft-tissue]
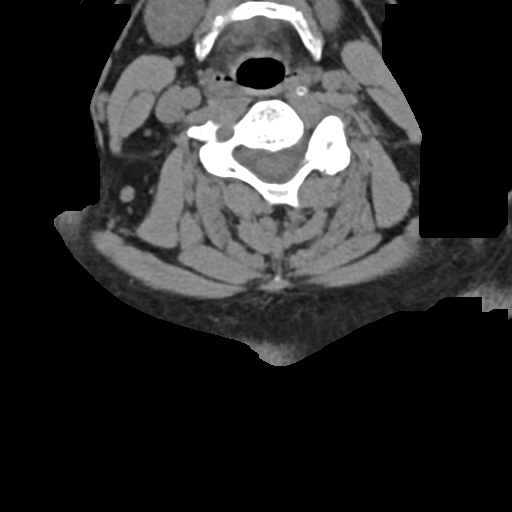
[im 61/102  soft-tissue]
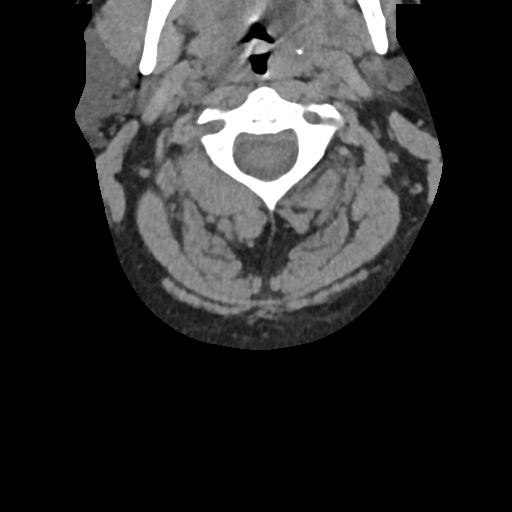

[Series 9: sagittal bone · sagittal · 0.25mm/px · 5 of 61 slices shown]
[im 11/61  bone]
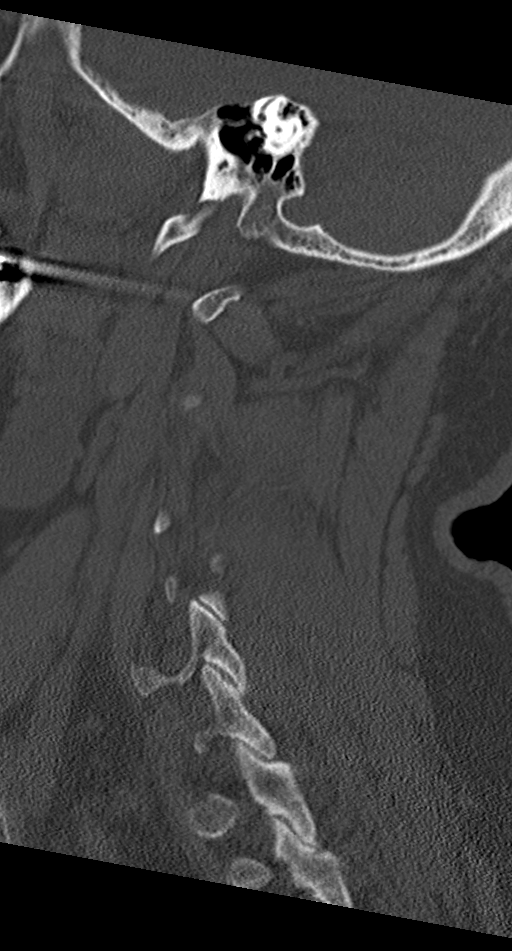
[im 21/61  bone]
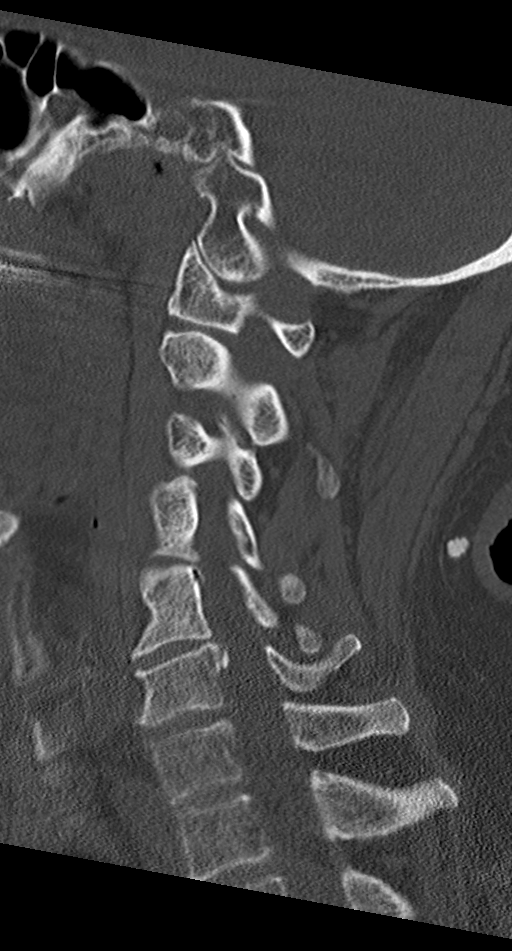
[im 31/61  bone]
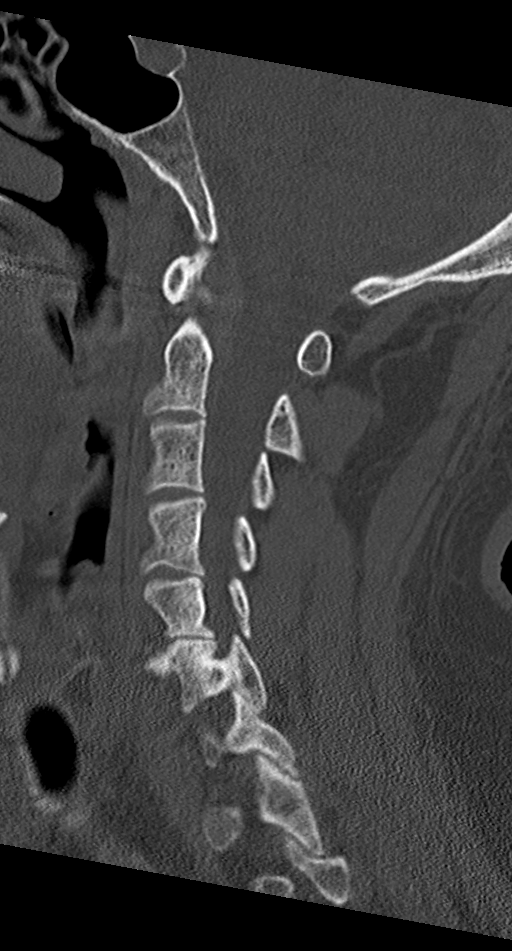
[im 41/61  bone]
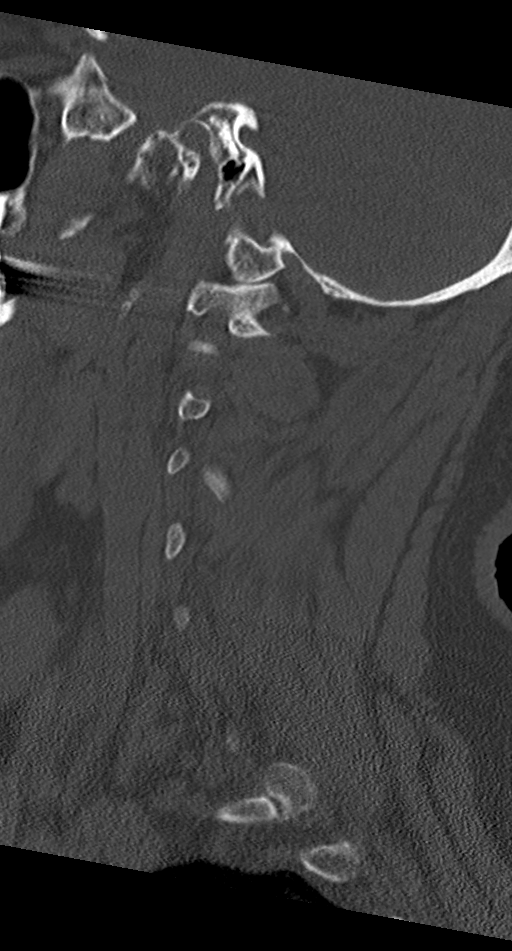
[im 51/61  bone]
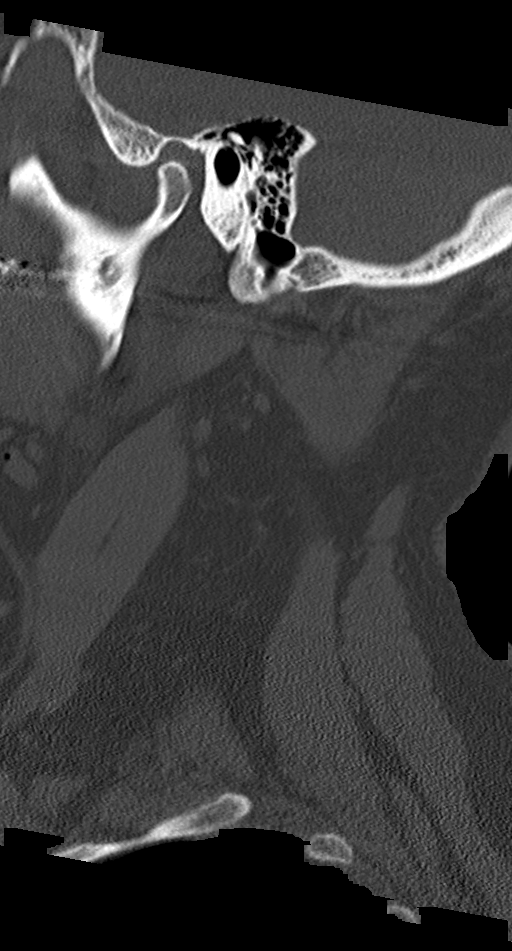

[Series 10: coronal bone · coronal · 0.30mm/px · 1 of 61 slices shown]
[im 31/61  bone]
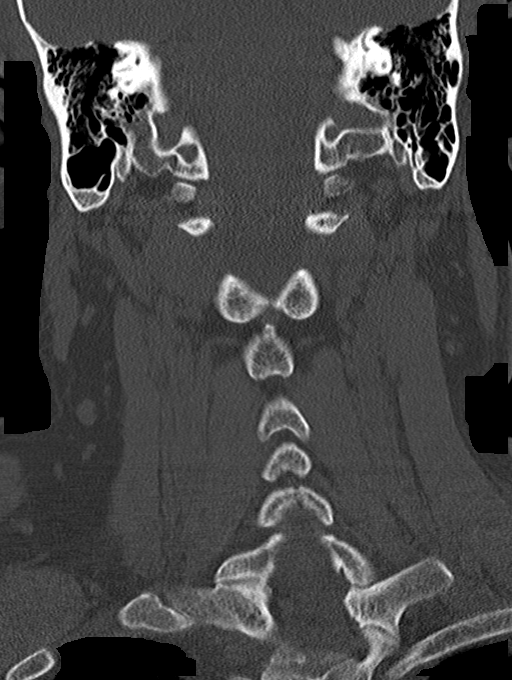

[Series 11: orthogonal bone · axial · 0.21mm/px · z∈[+122,+234]mm · 4 of 88 slices shown, 5 images]
[im 18/88  soft-tissue]
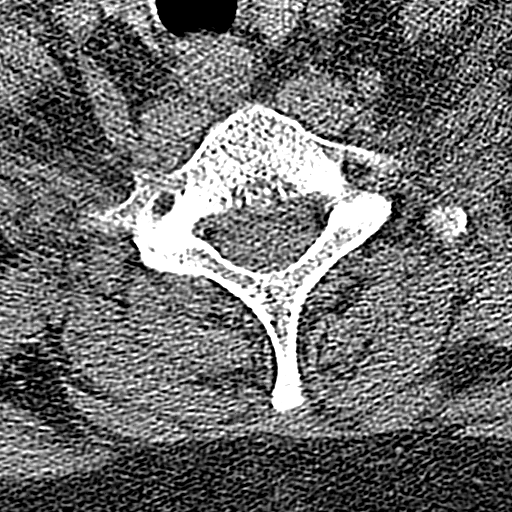
[im 18/88  bone]
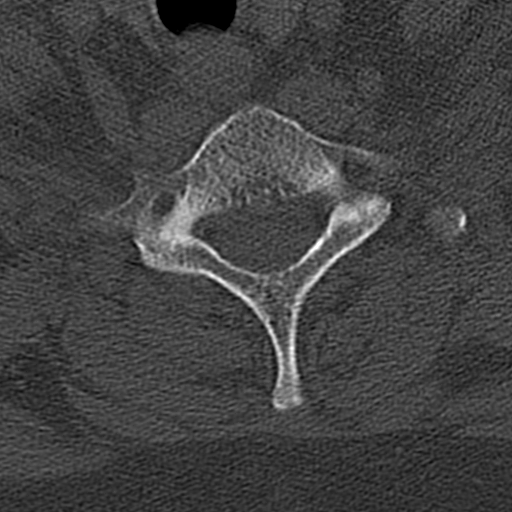
[im 35/88  bone]
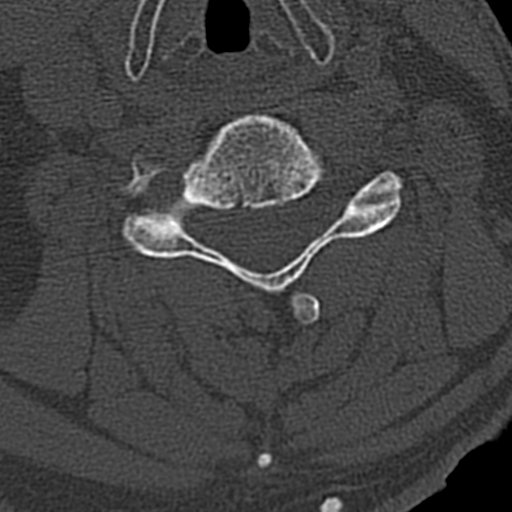
[im 53/88  bone]
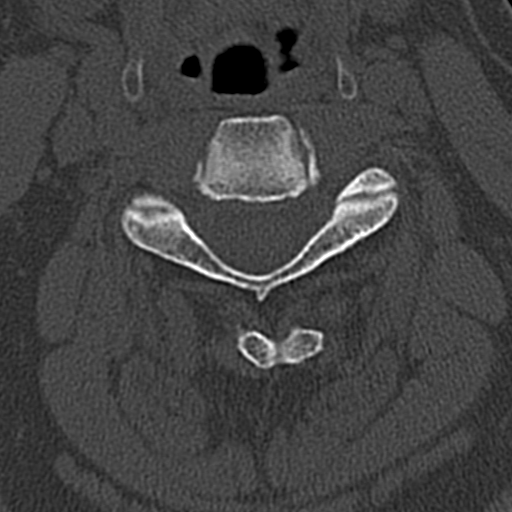
[im 70/88  bone]
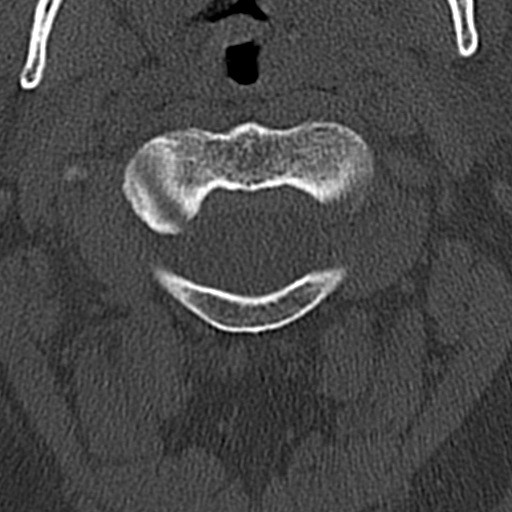

[13 of 33 positions shown; findings below may reference images not displayed]

FINDINGS: CT HEAD FINDINGS

Brain: Cerebral volume is normal for age. No midline shift,
ventriculomegaly, mass effect, evidence of mass lesion, intracranial
hemorrhage or evidence of cortically based acute infarction.
Gray-white matter differentiation is within normal limits throughout
the brain.

Vascular: Mild Calcified atherosclerosis at the skull base. No
suspicious intracranial vascular hyperdensity.

Skull: No skull fracture identified.

Sinuses/Orbits: Trace low-density appearing fluid level in the left
maxillary sinus. Chronic mild maxillary and left anterior ethmoid
mucosal thickening appears not significantly changed since 5110.
Other paranasal sinuses, tympanic cavities, and mastoids are stable
and well pneumatized.

Other: Left occiput region scalp hematoma measuring up to 9 mm in
thickness. Underlying occipital bones appear intact.

Other scalp soft tissues appear within normal limits. Visualized
orbit soft tissues are within normal limits.

CT CERVICAL SPINE FINDINGS

Alignment: Mild dextroconvex curvature of the cervical spine. Mild
straightening of lordosis. Cervicothoracic junction alignment is
within normal limits. Bilateral posterior element alignment is
within normal limits.

Skull base and vertebrae: Visualized skull base is intact. No
atlanto-occipital dissociation. No cervical spine fracture
identified.

Soft tissues and spinal canal: No prevertebral fluid or swelling. No
visible canal hematoma. Negative noncontrast neck soft tissues.

Disc levels: Moderate cervical facet degeneration on the left at
C2-C3 and C4-C5. Lower cervical disc space loss and endplate
spurring. Possible mild spinal stenosis at C5-C6 and C6-C7.

Upper chest: Visible upper thoracic levels appear intact. Negative
lung apices. Negative noncontrast thoracic inlet.
IMPRESSION: 1. Left occiput scalp hematoma without underlying skull fracture.
2.  Normal noncontrast CT appearance of the brain.
3. No acute fracture in the cervical spine. Possible degenerative
mild spinal stenosis at C5-C6 and C6-C7.
4. Mild paranasal sinus disease appears similar to a 5110 head CT.

## 2022-01-07 DIAGNOSIS — R6889 Other general symptoms and signs: Secondary | ICD-10-CM | POA: Insufficient documentation

## 2022-01-07 DIAGNOSIS — N400 Enlarged prostate without lower urinary tract symptoms: Secondary | ICD-10-CM | POA: Insufficient documentation

## 2022-01-07 DIAGNOSIS — R7303 Prediabetes: Secondary | ICD-10-CM | POA: Insufficient documentation

## 2023-04-22 ENCOUNTER — Encounter: Payer: Self-pay | Admitting: Orthopaedic Surgery

## 2023-04-22 ENCOUNTER — Other Ambulatory Visit: Payer: Self-pay | Admitting: Orthopaedic Surgery

## 2023-04-22 ENCOUNTER — Other Ambulatory Visit (INDEPENDENT_AMBULATORY_CARE_PROVIDER_SITE_OTHER): Payer: BC Managed Care – PPO

## 2023-04-22 ENCOUNTER — Ambulatory Visit: Payer: BLUE CROSS/BLUE SHIELD | Admitting: Orthopaedic Surgery

## 2023-04-22 VITALS — BP 138/76 | HR 78 | Ht 68.5 in | Wt 240.0 lb

## 2023-04-22 DIAGNOSIS — M7041 Prepatellar bursitis, right knee: Secondary | ICD-10-CM

## 2023-04-22 NOTE — Progress Notes (Signed)
Subjective:    Patient ID: Christopher Lin, male    DOB: [birth year], 62 y.o.   MRN: [id number]  HPI He fell about three months ago and hurt his right knee.  He has had swelling in the pre-patella area since then.  He has no redness, no weakness, no giving way.  It concerns him that the swelling has not gone away.  It is tender at times and he cannot squat and put pressure on the right knee.  He has no other injury.  He has tried ice, heat, Advil, rest.   Review of Systems  Constitutional:  Positive for activity change.  Musculoskeletal:  Positive for arthralgias, gait problem and joint swelling.  All other systems reviewed and are negative. For Review of Systems, all other systems reviewed and are negative.  The following is a summary of the past history medically, past history surgically, known current medicines, social history and family history.  This information is gathered electronically by the computer from prior information and documentation.  I review this each visit and have found including this information at this point in the chart is beneficial and informative.   Past Medical History:  Diagnosis Date   Concussion    Hyperlipidemia     Past Surgical History:  Procedure Laterality Date   COLONOSCOPY N/A 05/04/2014   Procedure: COLONOSCOPY;  Surgeon: Malissa Hippo, MD;  Location: [facility];  Service: Endoscopy;  Laterality: N/A;  730-rescheduled Ann notified pt   NO PAST SURGERIES      Current Outpatient Medications on File Prior to Visit  Medication Sig Dispense Refill   Biotin 56213 MCG TABS Take by mouth.     Cholecalciferol (VITAMIN D3) 5000 UNITS TABS Take 5,000 Units by mouth daily.     co-enzyme Q-10 30 MG capsule Take 30 mg by mouth 3 (three) times daily.     doxycycline (PERIOSTAT) 20 MG tablet Take 1 tablet by mouth 2 (two) times daily.     lovastatin (MEVACOR) 20 MG tablet Take 1 tablet by mouth at bedtime.     No current facility-administered  medications on file prior to visit.    Social History   Socioeconomic History   Marital status: Divorced    Spouse name: Not on file   Number of children: Not on file   Years of education: Not on file   Highest education level: Not on file  Occupational History   Not on file  Tobacco Use   Smoking status: Never   Smokeless tobacco: Never  Substance and Sexual Activity   Alcohol use: No   Drug use: No   Sexual activity: Not Currently  Other Topics Concern   Not on file  Social History Narrative   Not on file   Social Determinants of Health   Financial Resource Strain: Not on file  Food Insecurity: Not on file  Transportation Needs: Not on file  Physical Activity: Not on file  Stress: Not on file  Social Connections: Not on file  Intimate Partner Violence: Not on file    Family History  Adopted: Yes    BP 138/76   Pulse 78   Ht 5' 8.5" (1.74 m)   Wt 240 lb (108.9 kg)   BMI 35.96 kg/m   Body mass index is 35.96 kg/m.      Objective:   Physical Exam Vitals and nursing note reviewed. Exam conducted with a chaperone present.  Constitutional:      Appearance: He is  well-developed.  HENT:     Head: Normocephalic and atraumatic.  Eyes:     Conjunctiva/sclera: Conjunctivae normal.     Pupils: Pupils are equal, round, and reactive to light.  Cardiovascular:     Rate and Rhythm: Normal rate and regular rhythm.  Pulmonary:     Effort: Pulmonary effort is normal.  Abdominal:     Palpations: Abdomen is soft.  Musculoskeletal:     Cervical back: Normal range of motion and neck supple.       Legs:  Skin:    General: Skin is warm and dry.  Neurological:     Mental Status: He is alert and oriented to person, place, and time.     Cranial Nerves: No cranial nerve deficit.     Motor: No abnormal muscle tone.     Coordination: Coordination normal.     Deep Tendon Reflexes: Reflexes are normal and symmetric. Reflexes normal.  Psychiatric:        Behavior:  Behavior normal.        Thought Content: Thought content normal.        Judgment: Judgment normal.   X-rays were done of the right knee, reported separately.        Assessment & Plan:   Encounter Diagnosis  Name Primary?   Prepatellar bursitis of right knee Yes   Procedure note; After permission from the patient and prep of the right knee, I aspirated 15 cc of bloody fluid from the pre-patella bursa of the right knee.  It was done by sterile technique and tolerated well.  I told him the bursa may swell up again and he might need to consider surgery excision of the bursa.  Return in three weeks.  Call if any problem.  Precautions discussed.  Electronically Signed Darreld Mclean, MD 8/14/202411:58 AM

## 2023-05-05 ENCOUNTER — Ambulatory Visit: Payer: BC Managed Care – PPO | Admitting: Orthopaedic Surgery

## 2023-05-05 ENCOUNTER — Encounter: Payer: Self-pay | Admitting: Orthopaedic Surgery

## 2023-05-05 VITALS — Ht 68.5 in | Wt 240.0 lb

## 2023-05-05 DIAGNOSIS — M7041 Prepatellar bursitis, right knee: Secondary | ICD-10-CM

## 2023-05-05 NOTE — Progress Notes (Signed)
It has recurred.  His right pre-patella effusion bursitis has recurred.  It is bigger than the last time.  He has no new trauma.  He has a large pre-patella effusion today, size of a lime.  It is not red.  ROM of the knee is good.  He has no effusion of the knee.  NV intact.  Encounter Diagnosis  Name Primary?   Prepatellar bursitis of right knee Yes   Procedure note: After permission from the patient and prep of the right anterior knee, I aspirated 18 cc from the bursa and instilled 1 cc DepoMedrol 40.  He tolerated it well.  Sterile technique was used.  I have told him it will do better to excise this.  I will give him a knee sleeve and make an appointment to see Dr. Romeo Apple for consideration of surgery.  Call if any problem.  Precautions discussed.  Electronically Signed Darreld Mclean, MD 8/27/20249:06 AM

## 2023-05-20 ENCOUNTER — Ambulatory Visit: Payer: BC Managed Care – PPO | Admitting: Orthopaedic Surgery

## 2023-05-29 ENCOUNTER — Encounter: Payer: Self-pay | Admitting: Orthopedic Surgery

## 2023-05-29 ENCOUNTER — Ambulatory Visit: Payer: BC Managed Care – PPO | Admitting: Orthopedic Surgery

## 2023-05-29 VITALS — BP 120/76 | HR 75 | Ht 68.0 in | Wt 259.0 lb

## 2023-05-29 DIAGNOSIS — M7051 Other bursitis of knee, right knee: Secondary | ICD-10-CM | POA: Diagnosis not present

## 2023-05-29 MED ORDER — METHYLPREDNISOLONE ACETATE 40 MG/ML IJ SUSP
40.0000 mg | Freq: Once | INTRAMUSCULAR | Status: AC
  Administered 2023-05-29: 40 mg via INTRA_ARTICULAR

## 2023-05-29 NOTE — Progress Notes (Addendum)
F/u  Chief Complaint  Patient presents with   Knee Pain    Surgery consult right knee , no pain , lots of fluid    62 yo male s/p 2 asp for pre pat bursitis  Here for consideration of surgery   Would like to avoid       Right Knee Exam   Comments:  Right knee  Large swelling over right knee/ -no erythema -no tenderness     Aspiration of right pre patellar bursitis   Alcohol swab to skin x 3  Consent  18g needle aspirated: 12 cc dark blood    Compression dressing applied   F/u prn

## 2024-03-25 ENCOUNTER — Encounter (INDEPENDENT_AMBULATORY_CARE_PROVIDER_SITE_OTHER): Payer: Self-pay | Admitting: *Deleted

## 2024-04-19 ENCOUNTER — Telehealth: Payer: Self-pay

## 2024-04-19 NOTE — Telephone Encounter (Signed)
 Who is your primary care physician: [facility]  Reasons for the colonoscopy: screening  Have you had a colonoscopy before?  Yes 2015  Do you have family history of colon cancer? No I am adopted  Previous colonoscopy with polyps removed? no  Do you have a history colorectal cancer?   no  Are you diabetic? If yes, Type 1 or Type 2?    no  Do you have a prosthetic or mechanical heart valve? no  Do you have a pacemaker/defibrillator?   no  Have you had endocarditis/atrial fibrillation? no  Have you had joint replacement within the last 12 months?  no  Do you tend to be constipated or have to use laxatives? no  Do you have any history of drugs or alchohol?  no  Do you use supplemental oxygen?  no  Have you had a stroke or heart attack within the last 6 months? no  Do you take weight loss medication?  no    Do you take any blood-thinning medications such as: (aspirin, warfarin, Plavix, Aggrenox)  no  If yes we need the name, milligram, dosage and who is prescribing doctor  Current Outpatient Medications on File Prior to Visit  Medication Sig Dispense Refill   co-enzyme Q-10 30 MG capsule Take 30 mg by mouth 3 (three) times daily.     doxycycline (PERIOSTAT) 20 MG tablet Take 1 tablet by mouth 2 (two) times daily.     lovastatin (MEVACOR) 20 MG tablet Take 1 tablet by mouth at bedtime.     tamsulosin (FLOMAX) 0.4 MG CAPS capsule Take 0.4 mg by mouth.     No current facility-administered medications on file prior to visit.    Allergies  Allergen Reactions   Penicillins Other (See Comments)     Pharmacy: [facility]  Primary Insurance Name: no insurance information attached  Best number where you can be reached: [phone]

## 2024-04-19 NOTE — Telephone Encounter (Signed)
Ok to schedule.  Room : any   Thanks,  Vista Lawman, MD Gastroenterology and Hepatology [facility]

## 2024-04-20 NOTE — Telephone Encounter (Signed)
 Spoke with pt. He wants a call once we get Oct schedule. He wants a Friday AM ? 10/17 or 10/24. Advised will call once we do.

## 2024-04-29 ENCOUNTER — Encounter: Payer: Self-pay | Admitting: Radiology

## 2024-05-23 MED ORDER — PEG 3350-KCL-NA BICARB-NACL 420 G PO SOLR: 4000.0000 mL | Freq: Once | ORAL | 0 refills | Status: AC

## 2024-05-23 NOTE — Addendum Note (Signed)
 Addended by: DALLIE LIONEL RAMAN on: 05/23/2024 11:29 AM   Modules accepted: Orders

## 2024-05-23 NOTE — Telephone Encounter (Signed)
 Spoke with pt, scheduled TCS for 06/27/2024 at 12:15pm. Rx sent to [facility]. Instructions mailed.

## 2024-05-25 NOTE — Telephone Encounter (Signed)
 Questionnaire from recall, no referral needed

## 2024-06-27 ENCOUNTER — Other Ambulatory Visit: Payer: Self-pay

## 2024-06-27 ENCOUNTER — Ambulatory Visit (HOSPITAL_COMMUNITY): Payer: Self-pay | Admitting: Anesthesiology

## 2024-06-27 ENCOUNTER — Ambulatory Visit (HOSPITAL_COMMUNITY)
Admission: RE | Admit: 2024-06-27 | Discharge: 2024-06-27 | Disposition: A | Discharge status: Home / Self Care | Attending: Gastroenterology | Admitting: Gastroenterology

## 2024-06-27 ENCOUNTER — Encounter (HOSPITAL_COMMUNITY): Payer: Self-pay | Admitting: Gastroenterology

## 2024-06-27 ENCOUNTER — Encounter (HOSPITAL_COMMUNITY)
Admission: RE | Disposition: A | Discharge status: Home / Self Care | Payer: Self-pay | Source: Home / Self Care | Attending: Gastroenterology

## 2024-06-27 DIAGNOSIS — K635 Polyp of colon: Secondary | ICD-10-CM

## 2024-06-27 DIAGNOSIS — Z1211 Encounter for screening for malignant neoplasm of colon: Secondary | ICD-10-CM | POA: Insufficient documentation

## 2024-06-27 DIAGNOSIS — K5730 Diverticulosis of large intestine without perforation or abscess without bleeding: Secondary | ICD-10-CM | POA: Diagnosis not present

## 2024-06-27 DIAGNOSIS — Z79899 Other long term (current) drug therapy: Secondary | ICD-10-CM | POA: Insufficient documentation

## 2024-06-27 DIAGNOSIS — D123 Benign neoplasm of transverse colon: Secondary | ICD-10-CM | POA: Insufficient documentation

## 2024-06-27 DIAGNOSIS — K648 Other hemorrhoids: Secondary | ICD-10-CM | POA: Insufficient documentation

## 2024-06-27 DIAGNOSIS — D124 Benign neoplasm of descending colon: Secondary | ICD-10-CM | POA: Diagnosis not present

## 2024-06-27 DIAGNOSIS — D120 Benign neoplasm of cecum: Secondary | ICD-10-CM | POA: Diagnosis not present

## 2024-06-27 HISTORY — PX: COLONOSCOPY: SHX5424

## 2024-06-27 LAB — HM COLONOSCOPY

## 2024-06-27 SURGERY — COLONOSCOPY
Anesthesia: General

## 2024-06-27 MED ORDER — DEXMEDETOMIDINE HCL IN NACL 80 MCG/20ML IV SOLN
INTRAVENOUS | Status: DC | PRN
Start: 2024-06-27 — End: 2024-06-27
  Administered 2024-06-27: 6 ug via INTRAVENOUS

## 2024-06-27 MED ORDER — PHENYLEPHRINE HCL (PRESSORS) 10 MG/ML IV SOLN
INTRAVENOUS | Status: DC | PRN
  Administered 2024-06-27 (×2): 80 ug via INTRAVENOUS

## 2024-06-27 MED ORDER — PROPOFOL 10 MG/ML IV BOLUS
INTRAVENOUS | Status: DC | PRN
Start: 2024-06-27 — End: 2024-06-27
  Administered 2024-06-27 (×2): 50 mg via INTRAVENOUS

## 2024-06-27 MED ORDER — LIDOCAINE 2% (20 MG/ML) 5 ML SYRINGE
INTRAMUSCULAR | Status: DC | PRN
  Administered 2024-06-27: 80 mg via INTRAVENOUS

## 2024-06-27 MED ORDER — LACTATED RINGERS IV SOLN: INTRAVENOUS | Status: DC | PRN

## 2024-06-27 MED ORDER — LACTATED RINGERS IV SOLN: INTRAVENOUS | Status: DC

## 2024-06-27 MED ORDER — PROPOFOL 500 MG/50ML IV EMUL
INTRAVENOUS | Status: DC | PRN
Start: 2024-06-27 — End: 2024-06-27
  Administered 2024-06-27: 150 ug/kg/min via INTRAVENOUS

## 2024-06-27 NOTE — Anesthesia Preprocedure Evaluation (Signed)

## 2024-06-27 NOTE — Transfer of Care (Signed)
 Immediate Anesthesia Transfer of Care Note  Patient: Christopher Lin  Procedure(s) Performed: COLONOSCOPY  Patient Location: PACU  Anesthesia Type:MAC  Level of Consciousness: awake and alert   Airway & Oxygen Therapy: Patient Spontanous Breathing and Patient connected to nasal cannula oxygen  Post-op Assessment: Report given to RN and Post -op Vital signs reviewed and stable  Post vital signs: Reviewed and stable  Last Vitals:  Vitals Value Taken Time  BP 76/54 06/27/24 12:27  Temp 36.8 C 06/27/24 12:27  Pulse 75 06/27/24 12:27  Resp 19 06/27/24 12:27  SpO2 96 % 06/27/24 12:27    Last Pain:  Vitals:   06/27/24 1227  TempSrc: Oral  PainSc: 0-No pain      Patients Stated Pain Goal: 8 (06/27/24 1052)  Complications: No notable events documented.

## 2024-06-27 NOTE — Op Note (Signed)
 [facility] Patient Name: Christopher Lin Procedure Date: 06/27/2024 11:49 AM MRN: [id number] Date of Birth: 1961-04-27 Attending MD: Deatrice Dine , MD, [id number] CSN: [id number] Age: 63 Admit Type: Outpatient Procedure:                Colonoscopy Indications:              Screening for colorectal malignant neoplasm Providers:                Deatrice Dine, MD, Leandrew Edelman RN, RN, Mickel CROME.                            Shirlean Balm, Technician Referring MD:              Medicines:                Monitored Anesthesia Care Complications:            No immediate complications. Estimated Blood Loss:     Estimated blood loss was minimal. Procedure:                Pre-Anesthesia Assessment:                           - Prior to the procedure, a History and Physical                            was performed, and patient medications and                            allergies were reviewed. The patient's tolerance of                            previous anesthesia was also reviewed. The risks                            and benefits of the procedure and the sedation                            options and risks were discussed with the patient.                            All questions were answered, and informed consent                            was obtained. Prior Anticoagulants: The patient has                            taken no anticoagulant or antiplatelet agents. ASA                            Grade Assessment: II - A patient with mild systemic                            disease. After reviewing the risks and benefits,  the patient was deemed in satisfactory condition to                            undergo the procedure.                           After obtaining informed consent, the colonoscope                            was passed under direct vision. Throughout the                            procedure, the patient's blood pressure, pulse, and                             oxygen saturations were monitored continuously. The                            [id number] ([id number]) Colon was introduced through                            the anus and advanced to the the cecum, identified                            by appendiceal orifice and ileocecal valve. The                            ileocecal valve, appendiceal orifice, and rectum                            were photographed. Scope In: 12:00:23 PM Scope Out: 12:20:19 PM Scope Withdrawal Time: 0 hours 12 minutes 53 seconds  Total Procedure Duration: 0 hours 19 minutes 56 seconds  Findings:      The perianal and digital rectal examinations were normal.      Seven sessile polyps were found in the descending colon, transverse       colon and cecum. The polyps were 4 to 7 mm in size. These polyps were       removed with a cold snare. Resection and retrieval were complete.      A few small-mouthed diverticula were found in the left colon.      Non-bleeding internal hemorrhoids were found during retroflexion. The       hemorrhoids were small. Impression:               - Seven 4 to 7 mm polyps in the descending colon,                            in the transverse colon and in the cecum, removed                            with a cold snare. Resected and retrieved.                           - Diverticulosis in the left colon.                           -  Non-bleeding internal hemorrhoids. Moderate Sedation:      Per Anesthesia Care Recommendation:           - Patient has a contact number available for                            emergencies. The signs and symptoms of potential                            delayed complications were discussed with the                            patient. Return to normal activities tomorrow.                            Written discharge instructions were provided to the                            patient.                           - Resume previous diet.                           -  Continue present medications.                           - Await pathology results.                           - Repeat colonoscopy in 3 years for surveillance                            based on pathology results and for surveillance of                            multiple polyps.                           - Return to primary care physician as previously                            scheduled. Procedure Code(s):        --- Professional ---                           [phone], Colonoscopy, flexible; with removal of                            tumor(s), polyp(s), or other lesion(s) by snare                            technique Diagnosis Code(s):        --- Professional ---                           Z12.11, Encounter for screening for malignant  neoplasm of colon                           D12.4, Benign neoplasm of descending colon                           D12.3, Benign neoplasm of transverse colon (hepatic                            flexure or splenic flexure)                           D12.0, Benign neoplasm of cecum                           K64.8, Other hemorrhoids                           K57.30, Diverticulosis of large intestine without                            perforation or abscess without bleeding CPT copyright 2022 American [employer] Association. All rights reserved. The codes documented in this report are preliminary and upon coder review may  be revised to meet current compliance requirements. Deatrice Dine, MD Deatrice Dine, MD 06/27/2024 12:25:03 PM This report has been signed electronically. Number of Addenda: 0

## 2024-06-27 NOTE — H&P (Signed)
 Primary Care Physician:  Benjamin Raina Elizabeth, NP Primary Gastroenterologist:  Dr. Cinderella  Pre-Procedure History & Physical: HPI:  Christopher Lin is a 62 y.o. male is here for a colonoscopy for colon cancer screening purposes.  No melena or hematochezia.  No abdominal pain or unintentional weight loss.  No change in bowel habits.  Overall feels well from a GI standpoint.  Patient is adopted and family history if unknown .    Past Medical History:  Diagnosis Date   Concussion    Hyperlipidemia     Past Surgical History:  Procedure Laterality Date   COLONOSCOPY N/A 05/04/2014   Procedure: COLONOSCOPY;  Surgeon: Claudis RAYMOND Rivet, MD;  Location: [facility];  Service: Endoscopy;  Laterality: N/A;  730-rescheduled Ann notified pt   EYE SURGERY Left    laser procedure   NO PAST SURGERIES      Prior to Admission medications   Medication Sig Start Date End Date Taking? Authorizing Provider  co-enzyme Q-10 30 MG capsule Take 30 mg by mouth 3 (three) times daily.    [provider]  doxycycline (PERIOSTAT) 20 MG tablet Take 1 tablet by mouth 2 (two) times daily. 10/06/17   [provider]  lovastatin (MEVACOR) 20 MG tablet Take 1 tablet by mouth at bedtime. 10/06/17   [provider]  tamsulosin (FLOMAX) 0.4 MG CAPS capsule Take 0.4 mg by mouth.    [provider]  Vitamin D , Ergocalciferol , (DRISDOL) 1.25 MG (50000 UNIT) CAPS capsule Take 50,000 Units by mouth daily.    [provider]    Allergies as of 05/23/2024 - Review Complete 04/19/2024  Allergen Reaction Noted   Penicillins Other (See Comments) 11/07/2017    Family History  Adopted: Yes    Social History   Socioeconomic History   Marital status: Divorced    Spouse name: Not on file   Number of children: Not on file   Years of education: Not on file   Highest education level: Not on file  Occupational History   Not on file  Tobacco Use   Smoking status: Never    Smokeless tobacco: Never  Vaping Use   Vaping status: Never Used  Substance and Sexual Activity   Alcohol use: No   Drug use: No   Sexual activity: Not Currently  Other Topics Concern   Not on file  Social History Narrative   Not on file   Social Drivers of Health   Financial Resource Strain: Not on file  Food Insecurity: Not on file  Transportation Needs: Not on file  Physical Activity: Not on file  Stress: Not on file  Social Connections: Not on file  Intimate Partner Violence: Not on file    Review of Systems: See HPI, otherwise negative ROS  Physical Exam: Vital signs in last 24 hours: Temp:  [98.5 F (36.9 C)] 98.5 F (36.9 C) (10/20 1104) Pulse Rate:  [67] 67 (10/20 1104) Resp:  [17] 17 (10/20 1104) BP: (153)/(75) 153/75 (10/20 1104) SpO2:  [96 %] 96 % (10/20 1104) Weight:  [115.7 kg] 115.7 kg (10/20 1052)   General:   Alert,  Well-developed, well-nourished, pleasant and cooperative in NAD Head:  Normocephalic and atraumatic. Eyes:  Sclera clear, no icterus.   Conjunctiva pink. Ears:  Normal auditory acuity. Nose:  No deformity, discharge,  or lesions. Msk:  Symmetrical without gross deformities. Normal posture. Extremities:  Without clubbing or edema. Neurologic:  Alert and  oriented x4;  grossly normal neurologically. Skin:  Intact without significant lesions or rashes. Psych:  Alert and cooperative. Normal mood and affect.  Impression/Plan: Christopher Lin is here for a colonoscopy to be performed for colon cancer screening purposes.  The risks of the procedure including infection, bleed, or perforation as well as benefits, limitations, alternatives and imponderables have been reviewed with the patient. Questions have been answered. All parties agreeable.

## 2024-06-27 NOTE — Discharge Instructions (Signed)

## 2024-06-28 ENCOUNTER — Encounter (HOSPITAL_COMMUNITY): Payer: Self-pay | Admitting: Gastroenterology

## 2024-06-28 ENCOUNTER — Encounter (INDEPENDENT_AMBULATORY_CARE_PROVIDER_SITE_OTHER): Payer: Self-pay | Admitting: *Deleted

## 2024-06-28 LAB — SURGICAL PATHOLOGY

## 2024-06-30 NOTE — Anesthesia Postprocedure Evaluation (Signed)
 Anesthesia Post Note  Patient: Christopher Lin  Procedure(s) Performed: COLONOSCOPY  Patient location during evaluation: Phase II Anesthesia Type: General Level of consciousness: awake Pain management: pain level controlled Vital Signs Assessment: post-procedure vital signs reviewed and stable Respiratory status: spontaneous breathing and respiratory function stable Cardiovascular status: blood pressure returned to baseline and stable Postop Assessment: no headache and no apparent nausea or vomiting Anesthetic complications: no Comments: Late entry   No notable events documented.   Last Vitals:  Vitals:   06/27/24 1227 06/27/24 1232  BP: (!) 76/54 105/77  Pulse: 75   Resp: 19   Temp: 36.8 C   SpO2: 96%     Last Pain:  Vitals:   06/27/24 1232  TempSrc:   PainSc: 0-No pain                 Yvonna JINNY Bosworth

## 2024-07-04 ENCOUNTER — Ambulatory Visit (INDEPENDENT_AMBULATORY_CARE_PROVIDER_SITE_OTHER): Payer: Self-pay | Admitting: Gastroenterology

## 2024-07-04 NOTE — Progress Notes (Signed)
 3 yr TCS noted in recall Patient result letter mailed procedure note and pathology result faxed to PCP

## 2024-07-11 ENCOUNTER — Encounter: Payer: Self-pay | Admitting: Radiology
